# Patient Record
Sex: Female | Born: 1955 | Race: White | Hispanic: No | Marital: Married | Smoking: Former smoker
Health system: Southern US, Community
[De-identification: ages and names within clinical notes are randomized; demographics above are authoritative.]

## PROBLEM LIST (undated history)

## (undated) DIAGNOSIS — E785 Hyperlipidemia, unspecified: Secondary | ICD-10-CM

## (undated) DIAGNOSIS — F419 Anxiety disorder, unspecified: Secondary | ICD-10-CM

## (undated) DIAGNOSIS — I10 Essential (primary) hypertension: Secondary | ICD-10-CM

## (undated) DIAGNOSIS — K219 Gastro-esophageal reflux disease without esophagitis: Secondary | ICD-10-CM

## (undated) DIAGNOSIS — C801 Malignant (primary) neoplasm, unspecified: Secondary | ICD-10-CM

## (undated) HISTORY — PX: CHOLECYSTECTOMY: SHX55

## (undated) HISTORY — PX: ABDOMINAL HYSTERECTOMY: SHX81

## (undated) HISTORY — DX: Malignant (primary) neoplasm, unspecified: C80.1

---

## 1999-02-13 ENCOUNTER — Other Ambulatory Visit: Admission: RE | Admit: 1999-02-13 | Discharge: 1999-02-13 | Payer: Self-pay

## 2010-02-04 ENCOUNTER — Ambulatory Visit (HOSPITAL_COMMUNITY): Admission: RE | Admit: 2010-02-04 | Discharge: 2010-02-04 | Payer: Self-pay | Admitting: Obstetrics and Gynecology

## 2010-02-25 ENCOUNTER — Encounter: Admission: RE | Admit: 2010-02-25 | Discharge: 2010-02-25 | Payer: Self-pay | Admitting: Obstetrics and Gynecology

## 2011-02-21 ENCOUNTER — Other Ambulatory Visit (HOSPITAL_COMMUNITY): Payer: Self-pay | Admitting: Family Medicine

## 2011-02-21 DIAGNOSIS — Z1231 Encounter for screening mammogram for malignant neoplasm of breast: Secondary | ICD-10-CM

## 2011-03-10 ENCOUNTER — Ambulatory Visit (HOSPITAL_COMMUNITY)
Admission: RE | Admit: 2011-03-10 | Discharge: 2011-03-10 | Disposition: A | Discharge status: Home / Self Care | Payer: Self-pay | Source: Ambulatory Visit | Attending: Family Medicine | Admitting: Family Medicine

## 2011-03-10 DIAGNOSIS — Z1231 Encounter for screening mammogram for malignant neoplasm of breast: Secondary | ICD-10-CM

## 2012-04-01 ENCOUNTER — Other Ambulatory Visit (HOSPITAL_COMMUNITY): Payer: Self-pay | Admitting: Family Medicine

## 2014-04-21 ENCOUNTER — Other Ambulatory Visit (HOSPITAL_COMMUNITY): Payer: Self-pay | Admitting: Family Medicine

## 2014-04-21 DIAGNOSIS — Z1231 Encounter for screening mammogram for malignant neoplasm of breast: Secondary | ICD-10-CM

## 2014-05-09 ENCOUNTER — Ambulatory Visit (HOSPITAL_COMMUNITY)
Admission: RE | Admit: 2014-05-09 | Discharge: 2014-05-09 | Disposition: A | Discharge status: Home / Self Care | Payer: Self-pay | Source: Ambulatory Visit | Attending: Family Medicine | Admitting: Family Medicine

## 2014-05-09 DIAGNOSIS — Z1231 Encounter for screening mammogram for malignant neoplasm of breast: Secondary | ICD-10-CM

## 2017-02-12 ENCOUNTER — Other Ambulatory Visit: Payer: Self-pay

## 2017-02-12 DIAGNOSIS — Z1231 Encounter for screening mammogram for malignant neoplasm of breast: Secondary | ICD-10-CM

## 2017-05-18 ENCOUNTER — Other Ambulatory Visit: Payer: Self-pay | Admitting: Internal Medicine

## 2017-07-27 ENCOUNTER — Other Ambulatory Visit: Payer: Self-pay | Admitting: Obstetrics and Gynecology

## 2017-07-27 DIAGNOSIS — Z1231 Encounter for screening mammogram for malignant neoplasm of breast: Secondary | ICD-10-CM

## 2017-08-06 ENCOUNTER — Ambulatory Visit (HOSPITAL_COMMUNITY)
Admission: RE | Admit: 2017-08-06 | Discharge: 2017-08-06 | Disposition: A | Discharge status: Home / Self Care | Payer: Self-pay | Source: Ambulatory Visit | Attending: Obstetrics and Gynecology | Admitting: Obstetrics and Gynecology

## 2017-08-06 ENCOUNTER — Ambulatory Visit
Admission: RE | Admit: 2017-08-06 | Discharge: 2017-08-06 | Disposition: A | Discharge status: Home / Self Care | Payer: No Typology Code available for payment source | Source: Ambulatory Visit | Attending: Obstetrics and Gynecology | Admitting: Obstetrics and Gynecology

## 2017-08-06 ENCOUNTER — Encounter (HOSPITAL_COMMUNITY): Payer: Self-pay | Admitting: *Deleted

## 2017-08-06 VITALS — BP 108/62 | Ht 64.0 in | Wt 163.4 lb

## 2017-08-06 DIAGNOSIS — Z1231 Encounter for screening mammogram for malignant neoplasm of breast: Secondary | ICD-10-CM

## 2017-08-06 DIAGNOSIS — Z1239 Encounter for other screening for malignant neoplasm of breast: Secondary | ICD-10-CM

## 2017-08-06 HISTORY — DX: Anxiety disorder, unspecified: F41.9

## 2017-08-06 HISTORY — DX: Gastro-esophageal reflux disease without esophagitis: K21.9

## 2017-08-06 HISTORY — DX: Hyperlipidemia, unspecified: E78.5

## 2017-08-06 HISTORY — DX: Essential (primary) hypertension: I10

## 2017-08-06 NOTE — Patient Instructions (Signed)
Explained breast self awareness with Danton Sewer. Patient did not need a Pap smear today due to patient has a history of a hysterectomy for benign reasons. Let her know she doesn't need any further Pap smears due to her history of a hysterectomy for benign reasons. Referred patient to the [facility] for a screening mammogram. Appointment scheduled for Thursday, Aug 06, 2017 at 1630. Let patient know the [facility] will follow up with her within the next couple weeks with results of mammogram by letter or phone. Danton Sewer verbalized understanding.  Otoniel Myhand, Arvil Chaco, RN 3:31 PM

## 2017-08-06 NOTE — Progress Notes (Signed)
No complaints today.   Pap Smear: Pap smear not completed today. Last Pap smear was 3-4 years ago and normal per patient. Per patient has a history of an abnormal Pap smear 15 years ago that questionable colposcopy and/or cryotherapy was completed for follow-up. Patient has a history of a hysterectomy 10 years ago due to AUB and uterine prolapse. No Pap smear results are in Epic.  Physical exam: Breasts Breasts symmetrical. No skin abnormalities bilateral breasts. No nipple retraction bilateral breasts. No nipple discharge bilateral breasts. No lymphadenopathy. No lumps palpated bilateral breasts. No complaints of pain or tenderness on exam. Referred patient to the [facility] for a screening mammogram. Appointment scheduled for Thursday, Aug 06, 2017 at 1630.        Pelvic/Bimanual No Pap smear completed today since patient has a history of a hysterectomy for benign reasons. Pap smear not indicated per [facility] guidelines.   Smoking History: Patient has never smoked.  Patient Navigation: Patient education provided. Access to services provided for patient through [facility] program.   Colorectal Cancer Screening: Per patient had a colonoscopy completed 10 years ago. No complaints today. FIT Test given to patient to complete and return to [facility].  Breast and Cervical Cancer Risk Assessment: Patient has no family history of breast cancer, known genetic mutations, or radiation treatment to the chest before age 71. Per patient has a history of cervical dysplasia. Patient has no history of being immunocompromised or DES exposure in-utero.

## 2017-08-19 ENCOUNTER — Encounter (HOSPITAL_COMMUNITY): Payer: Self-pay | Admitting: *Deleted

## 2018-05-09 IMAGING — MG DIGITAL SCREENING BILATERAL MAMMOGRAM WITH TOMO AND CAD
8 series · 8 of 24 positions shown · non-contrast
Comparison: Previous exam(s).

CLINICAL DATA: Screening.

EXAM:
DIGITAL SCREENING BILATERAL MAMMOGRAM WITH TOMO AND CAD

[R MLO synth-2D]
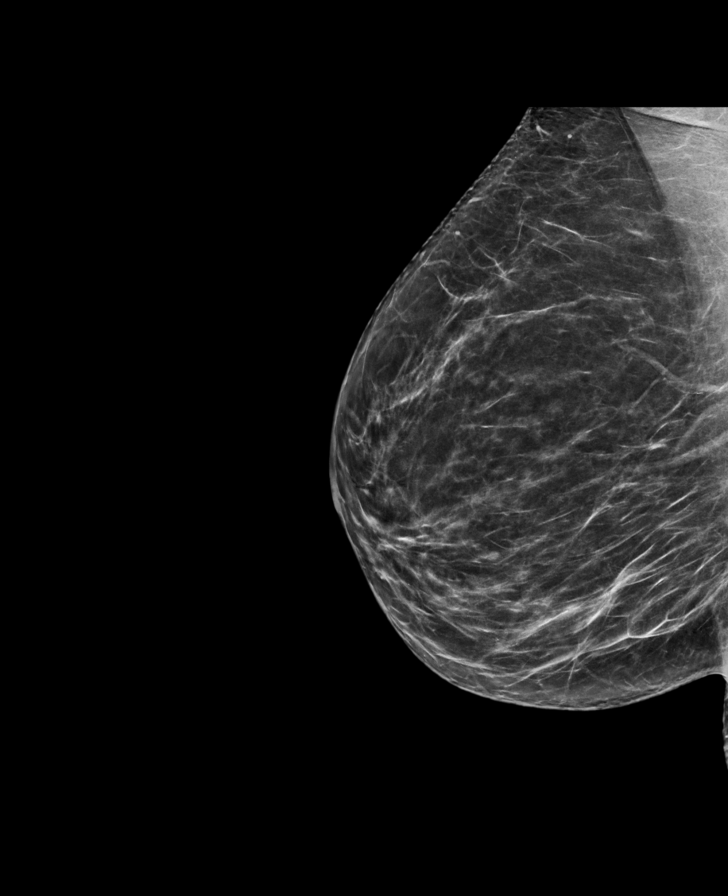

[R CC synth-2D]
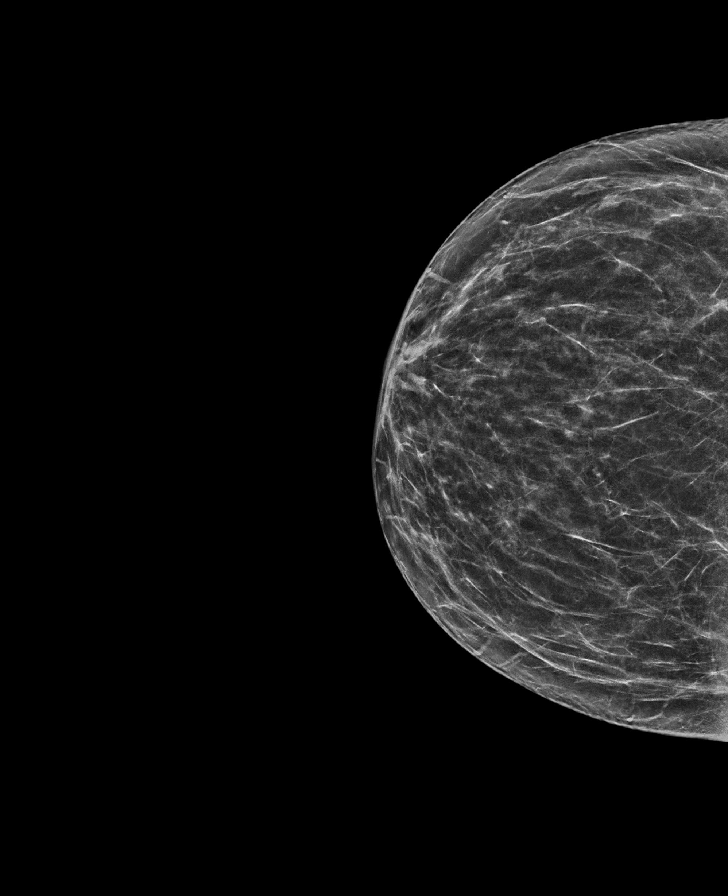

[L CC synth-2D]
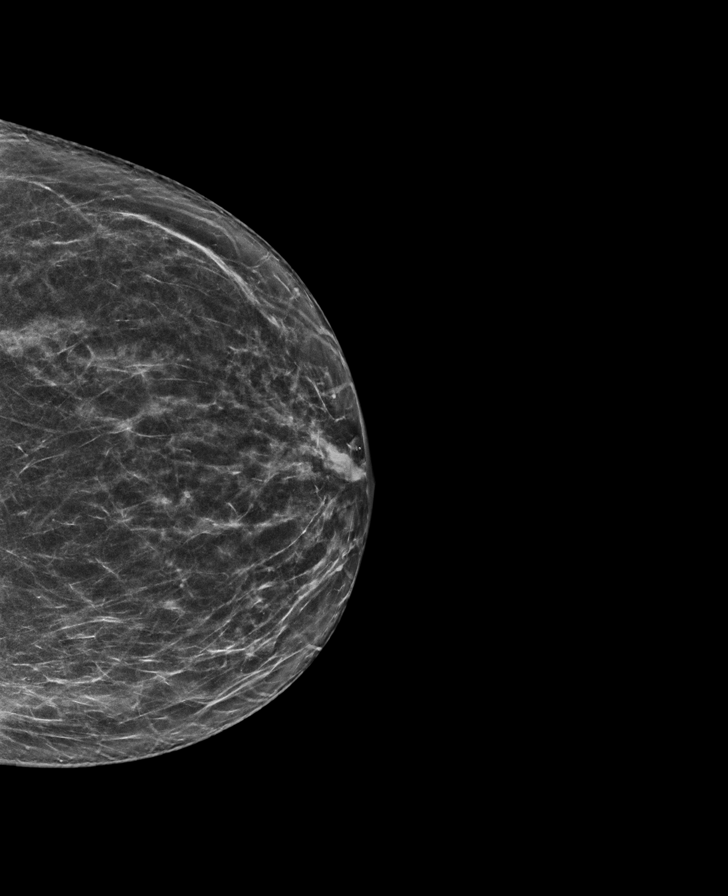

[L MLO synth-2D]
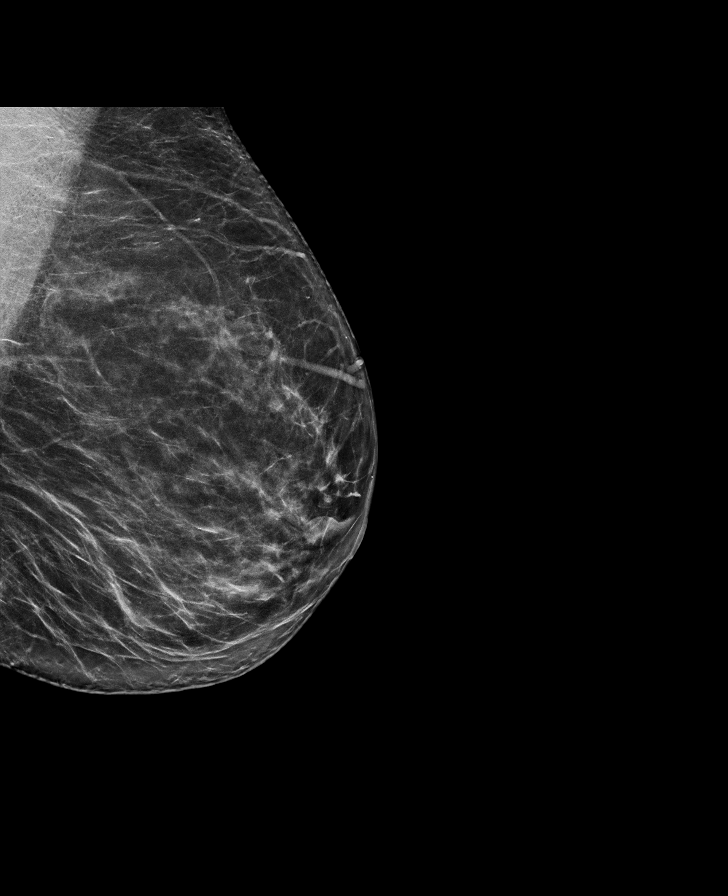

[L MLO tomo · tomo slice 40/79.0]
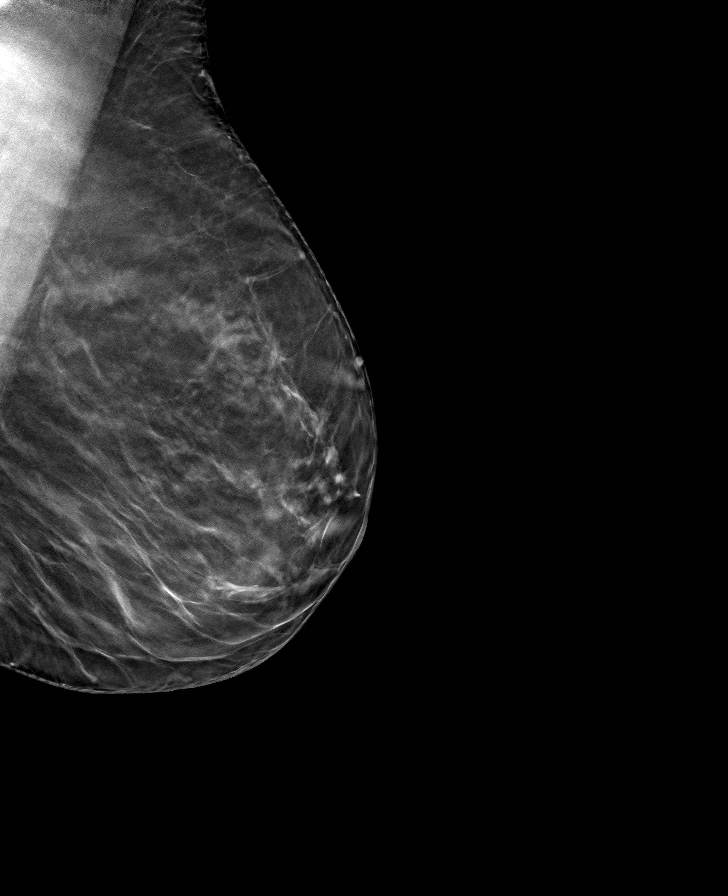

[R MLO tomo · tomo slice 37/74.0]
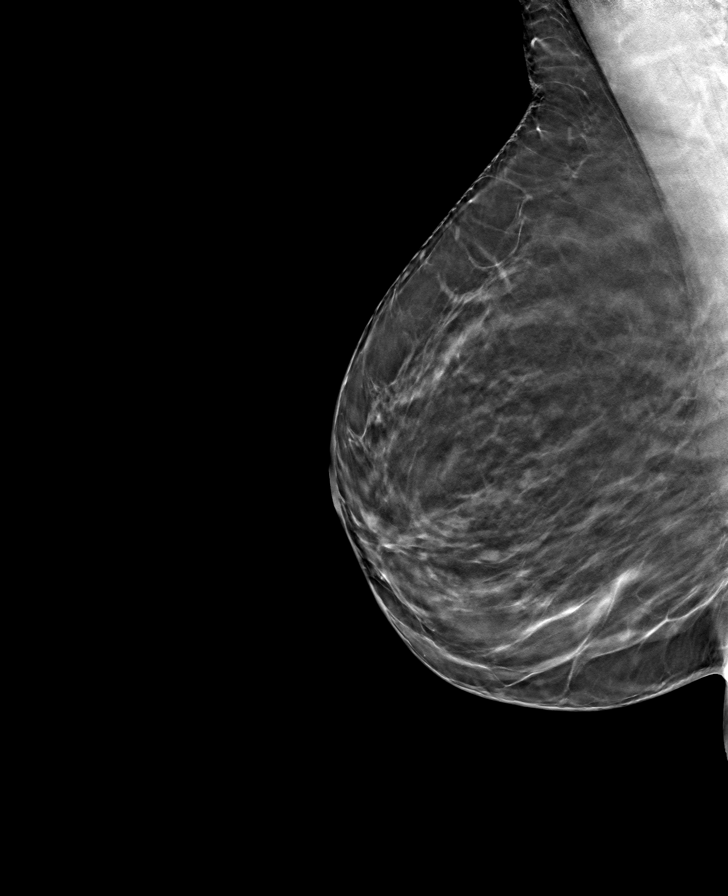

[R CC tomo · tomo slice 33/64.0]
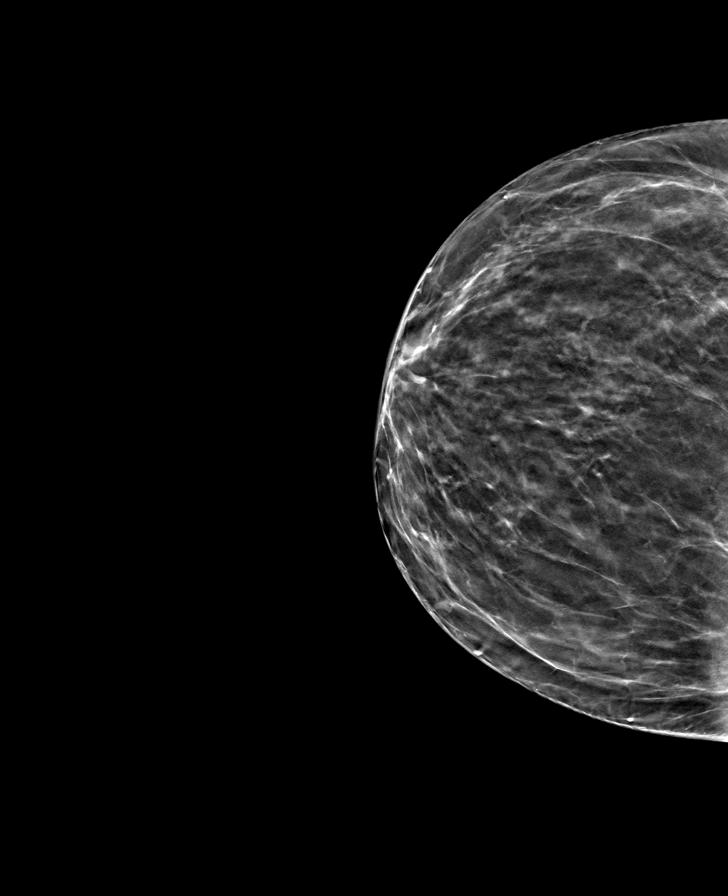

[L CC tomo · tomo slice 33/66.0]
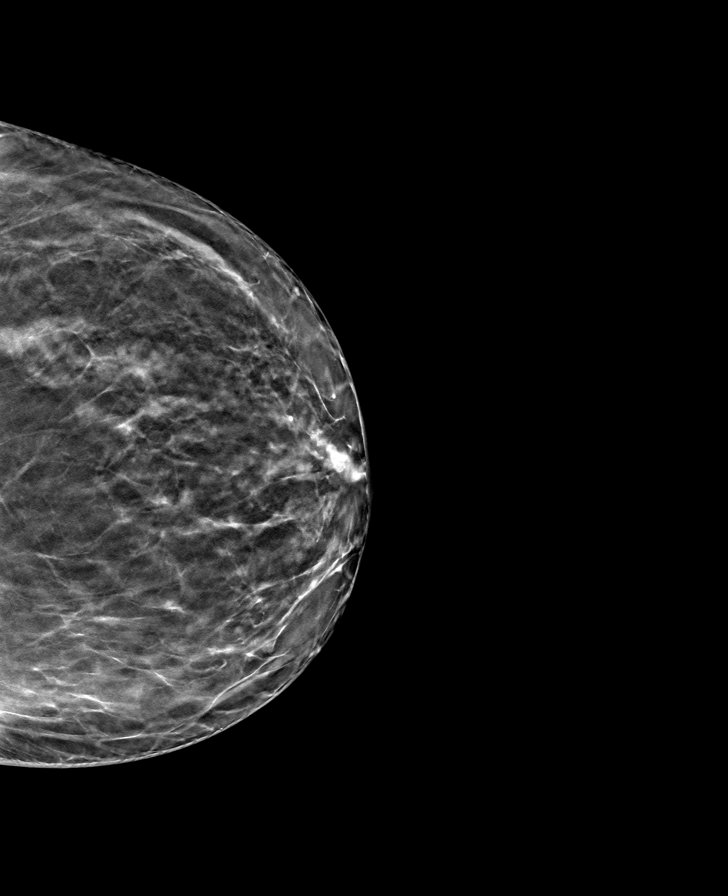

[8 of 24 positions shown; findings below may reference images not displayed]

ACR Breast Density Category c: The breast tissue is heterogeneously
dense, which may obscure small masses.
FINDINGS: There are no findings suspicious for malignancy. Images were
processed with CAD.
IMPRESSION: No mammographic evidence of malignancy. A result letter of this
screening mammogram will be mailed directly to the patient.

RECOMMENDATION:
Screening mammogram in one year. (Code:FT-U-LHB)

BI-RADS CATEGORY  1: Negative.

## 2020-07-09 ENCOUNTER — Other Ambulatory Visit: Payer: Self-pay | Admitting: General Surgery

## 2020-07-11 ENCOUNTER — Ambulatory Visit: Payer: No Typology Code available for payment source | Admitting: Gastroenterology

## 2020-08-21 ENCOUNTER — Encounter: Payer: Self-pay | Admitting: Gastroenterology

## 2020-08-21 ENCOUNTER — Ambulatory Visit: Payer: Medicare HMO | Admitting: Gastroenterology

## 2020-08-21 VITALS — BP 108/70 | HR 74 | Ht 65.0 in | Wt 173.0 lb

## 2020-08-21 DIAGNOSIS — K219 Gastro-esophageal reflux disease without esophagitis: Secondary | ICD-10-CM | POA: Diagnosis not present

## 2020-08-21 DIAGNOSIS — Z1211 Encounter for screening for malignant neoplasm of colon: Secondary | ICD-10-CM | POA: Diagnosis not present

## 2020-08-21 MED ORDER — PLENVU 140 G PO SOLR: 140.0000 g | ORAL | 0 refills | Status: DC

## 2020-08-21 NOTE — Progress Notes (Signed)
[facility] Consult Note:  History: Barbara Curtis 08/21/2020  Referring provider: Francesca Oman, DO  Reason for consult/chief complaint: Colon Cancer Screening (Last colonoscopy 12 or more years. Per patient no polyps. Father may have had colon cancer but she is unsure if it was colon primary or lung.) and Gastroesophageal Reflux (Well controlled on Prilosec)   Subjective  HPI:  This is a very pleasant 65 year old woman referred by primary care for colon cancer screening and history of reflux.  She previously saw Dr.Le in [facility], and had her last colonoscopy in 2012.  She also had an upper endoscopy on at least 1 occasion for reflux symptoms, and says a dilation was performed as well.  She has a least a decade of heartburn for which she needs daily omeprazole, and if she misses a dose she "will feel it".  [name] denies dysphagia odynophagia, nausea or vomiting. Her father was diagnosed with a late stage cancer, but she does not know for certain where the primary tumor was. Her bowel habits are regular without rectal bleeding. ROS:  Review of Systems  Constitutional: Negative for appetite change and unexpected weight change.  HENT: Negative for mouth sores and voice change.   Eyes: Negative for pain and redness.  Respiratory: Negative for cough and shortness of breath.   Cardiovascular: Negative for chest pain and palpitations.  Genitourinary: Negative for dysuria and hematuria.  Musculoskeletal: Negative for arthralgias and myalgias.  Skin: Negative for pallor and rash.  Neurological: Negative for weakness and headaches.  Hematological: Negative for adenopathy.     Past Medical History: Past Medical History:  Diagnosis Date  . Anxiety   . GERD (gastroesophageal reflux disease)   . Hyperlipidemia   . Hypertension      Past Surgical History: Past Surgical History:  Procedure Laterality Date  . ABDOMINAL HYSTERECTOMY    . CHOLECYSTECTOMY       She tells me about a urologic procedure done last year for stress incontinence, and that "an early stage tumor" was removed.  She had a follow-up exam that was reportedly normal, and has another follow-up procedure (?  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 Hypertension Mother   . Hypertension Father   . Diabetes Maternal Grandmother   . Colon cancer Neg Hx   . Pancreatic cancer Neg Hx   . Esophageal cancer Neg Hx     Social History: Social History   Socioeconomic History  . Marital status: Married    Spouse name: Not on file  . Number of children: Not on file  . Years of education: Not on file  . Highest education level: Not on file  Occupational History  . Not on file  Tobacco Use  . Smoking status: Never Smoker  . Smokeless tobacco: Never Used  Vaping Use  . Vaping Use: Never used  Substance and Sexual Activity  . Alcohol use: Not Currently  . Drug use: Not Currently  . Sexual activity: Yes  Other Topics Concern  . Not on file  Social History Narrative  . Not on file   Social Determinants of Health   Financial Resource Strain: Not on file  Food Insecurity: Not on file  Transportation Needs: Not on file  Physical Activity: Not on file  Stress: Not on file  Social Connections: Not on file    Allergies: No Known Allergies  Outpatient Meds: Current Outpatient Medications  Medication Sig Dispense Refill  . ALPRAZolam (XANAX) 1 MG tablet Take 1 mg by mouth daily.    Marland Kitchen amLODipine (NORVASC) 5 MG tablet Take 1 tablet by mouth daily.    Marland Kitchen aspirin 81 MG chewable tablet Chew 1 tablet by mouth daily.    Marland Kitchen atorvastatin (LIPITOR) 40 MG tablet Take 1 tablet by mouth daily.    . Ergocalciferol (VITAMIN D2 PO) Take 800 Units by mouth daily.    Marland Kitchen lisinopril  (ZESTRIL) 20 MG tablet Take 1 tablet by mouth daily.    . Multiple Vitamin (MULTI-VITAMIN) tablet Take 1 tablet by mouth daily.    Marland Kitchen omeprazole (PRILOSEC) 40 MG capsule Take 1 capsule by mouth daily.    Marland Kitchen PEG-KCl-NaCl-NaSulf-Na Asc-C (PLENVU) 140 g SOLR Take 140 g by mouth as directed. 1 each 0   No current facility-administered medications for this visit.      ___________________________________________________________________ Objective   Exam:  BP 108/70   Pulse 74   Ht 5\' 5"  (1.651 m)   Wt 173 lb (78.5 kg)   SpO2 97%   BMI 28.79 kg/m  Wt Readings from Last 3 Encounters:  08/21/20 173 lb (78.5 kg)  08/06/17 163 lb 6.4 oz (74.1 kg)     General: Well-appearing, normal vocal quality  Eyes: sclera anicteric, no redness  ENT: oral mucosa moist without lesions, no cervical or supraclavicular lymphadenopathy  CV: RRR without murmur, S1/S2, no JVD, no peripheral edema  Resp: clear to auscultation bilaterally, normal RR and effort noted  GI: soft, no tenderness, with active bowel sounds. No guarding or palpable organomegaly noted.  Skin; warm and dry, no rash or jaundice noted  Neuro: awake, alert and oriented x 3. Normal gross motor function and fluent speech   Assessment: Encounter Diagnoses  Name Primary?  . Gastroesophageal reflux disease, unspecified whether esophagitis present Yes  . Special screening for malignant neoplasms, colon     Average risk colorectal cancer, due for screening colonoscopy. Longstanding reflux symptoms requiring daily acid suppression therapy. We discussed the limitation of acid suppression therapy, breadth of diet and lifestyle changes required for reflux control, the possibility of reflux related complications such as esophagitis, stricture or Barrett's esophagus.  Other anatomic considerations such as gastric outlet obstruction or hiatal hernia may contribute to reflux.  Upper endoscopy warranted for Barrett's  screening.  Plan:  She was agreeable to EGD and colonoscopy after discussion of risks and benefits.  The benefits and risks of the planned procedure were described in detail with the patient or (when appropriate) their health care proxy.  Risks were outlined as including, but not limited to, bleeding, infection, perforation, adverse medication reaction leading to cardiac or pulmonary decompensation, pancreatitis (if ERCP).  The limitation of incomplete mucosal visualization was also discussed.  No guarantees or warranties were given.   Thank you for the courtesy of this consult.  Please call me with any questions or concerns.  Nelida Meuse III  CC: Referring provider noted above

## 2020-08-21 NOTE — Patient Instructions (Signed)
If you are age 67 or older, your body mass index should be between 23-30. Your Body mass index is 28.79 kg/m. If this is out of the aforementioned range listed, please consider follow up with your Primary Care Provider.  If you are age 90 or younger, your body mass index should be between 19-25. Your Body mass index is 28.79 kg/m. If this is out of the aformentioned range listed, please consider follow up with your Primary Care Provider.   You have been scheduled for an endoscopy and colonoscopy. Please follow the written instructions given to you at your visit today. Please pick up your prep supplies at the pharmacy within the next 1-3 days. If you use inhalers (even only as needed), please bring them with you on the day of your procedure.  It was a pleasure to see you today!  Thank you for trusting me with your gastrointestinal care!

## 2020-09-11 ENCOUNTER — Telehealth: Payer: Self-pay | Admitting: Gastroenterology

## 2020-09-11 NOTE — Telephone Encounter (Signed)
Patient called said [facility] has faxed twice an order to get an alternative medication due to them not having it in stock and also her insurance not covering.

## 2020-09-13 NOTE — Telephone Encounter (Signed)
Left a detailed voicemail for the patient that prep is at front desk for pick up.

## 2020-10-15 ENCOUNTER — Encounter: Payer: Medicare HMO | Admitting: Gastroenterology

## 2020-10-25 ENCOUNTER — Telehealth: Payer: Self-pay | Admitting: Gastroenterology

## 2020-10-25 NOTE — Telephone Encounter (Signed)
Inbound call from patient inquiring about what medications that she currently takes does she need to stop prior to procedure.  Please advise.

## 2020-10-25 NOTE — Telephone Encounter (Signed)
LM on vm. 

## 2020-10-25 NOTE — Telephone Encounter (Signed)
Questions answered and went over new instructions in detail with patient over phone.

## 2020-10-29 ENCOUNTER — Ambulatory Visit (AMBULATORY_SURGERY_CENTER): Payer: Medicare HMO | Admitting: Gastroenterology

## 2020-10-29 ENCOUNTER — Encounter: Payer: Self-pay | Admitting: Gastroenterology

## 2020-10-29 ENCOUNTER — Other Ambulatory Visit: Payer: Self-pay

## 2020-10-29 VITALS — BP 105/68 | HR 58 | Temp 98.0°F | Resp 18 | Ht 65.0 in | Wt 173.0 lb

## 2020-10-29 DIAGNOSIS — K449 Diaphragmatic hernia without obstruction or gangrene: Secondary | ICD-10-CM

## 2020-10-29 DIAGNOSIS — D122 Benign neoplasm of ascending colon: Secondary | ICD-10-CM

## 2020-10-29 DIAGNOSIS — K219 Gastro-esophageal reflux disease without esophagitis: Secondary | ICD-10-CM

## 2020-10-29 DIAGNOSIS — Z1211 Encounter for screening for malignant neoplasm of colon: Secondary | ICD-10-CM

## 2020-10-29 DIAGNOSIS — K259 Gastric ulcer, unspecified as acute or chronic, without hemorrhage or perforation: Secondary | ICD-10-CM | POA: Diagnosis not present

## 2020-10-29 MED ORDER — SODIUM CHLORIDE 0.9 % IV SOLN: 500.0000 mL | INTRAVENOUS | Status: DC

## 2020-10-29 NOTE — Progress Notes (Signed)
PT taken to PACU. Monitors in place. VSS. Report given to RN. 

## 2020-10-29 NOTE — Patient Instructions (Signed)
Handouts given for polyps and diverticulosis.  YOU HAD AN ENDOSCOPIC PROCEDURE TODAY AT [facility]:   Refer to the procedure report that was given to you for any specific questions about what was found during the examination.  If the procedure report does not answer your questions, please call your gastroenterologist to clarify.  If you requested that your care partner not be given the details of your procedure findings, then the procedure report has been included in a sealed envelope for you to review at your convenience later.  YOU SHOULD EXPECT: Some feelings of bloating in the abdomen. Passage of more gas than usual.  Walking can help get rid of the air that was put into your GI tract during the procedure and reduce the bloating. If you had a lower endoscopy (such as a colonoscopy or flexible sigmoidoscopy) you may notice spotting of blood in your stool or on the toilet paper. If you underwent a bowel prep for your procedure, you may not have a normal bowel movement for a few days.  Please Note:  You might notice some irritation and congestion in your nose or some drainage.  This is from the oxygen used during your procedure.  There is no need for concern and it should clear up in a day or so.  SYMPTOMS TO REPORT IMMEDIATELY:  Following lower endoscopy (colonoscopy or flexible sigmoidoscopy):  Excessive amounts of blood in the stool  Significant tenderness or worsening of abdominal pains  Swelling of the abdomen that is new, acute  Fever of 100F or higher  Following upper endoscopy (EGD)  Vomiting of blood or coffee ground material  New chest pain or pain under the shoulder blades  Painful or persistently difficult swallowing  New shortness of breath  Black, tarry-looking stools  For urgent or emergent issues, a gastroenterologist can be reached at any hour by calling [phone]. Do not use MyChart messaging for urgent concerns.    DIET:  We do recommend a small  meal at first, but then you may proceed to your regular diet.  Drink plenty of fluids but you should avoid alcoholic beverages for 24 hours.  ACTIVITY:  You should plan to take it easy for the rest of today and you should NOT DRIVE or use heavy machinery until tomorrow (because of the sedation medicines used during the test).    FOLLOW UP: Our staff will call the number listed on your records 48-72 hours following your procedure to check on you and address any questions or concerns that you may have regarding the information given to you following your procedure. If we do not reach you, we will leave a message.  We will attempt to reach you two times.  During this call, we will ask if you have developed any symptoms of COVID 19. If you develop any symptoms (ie: fever, flu-like symptoms, shortness of breath, cough etc.) before then, please call [phone].  If you test positive for Covid 19 in the 2 weeks post procedure, please call and report this information to [place].    If any biopsies were taken you will be contacted by phone or by letter within the next 1-3 weeks.  Please call us at [phone] if you have not heard about the biopsies in 3 weeks.    SIGNATURES/CONFIDENTIALITY: You and/or your care partner have signed paperwork which will be entered into your electronic medical record.  These signatures attest to the fact that that the information above on your  After Visit Summary has been reviewed and is understood.  Full responsibility of the confidentiality of this discharge information lies with you and/or your care-partner.

## 2020-10-29 NOTE — Progress Notes (Signed)
Called to room to assist during endoscopic procedure.  Patient ID and intended procedure confirmed with present staff. Received instructions for my participation in the procedure from the performing physician.  

## 2020-10-29 NOTE — Op Note (Signed)
[name] Procedure Date: 10/29/2020 3:12 PM MRN: [id number] Endoscopist: [place]. Loletha Carrow , MD Age: 65 Referring MD:  Date of Birth: 1956-02-03 Gender: Female Account #: [id number] Procedure:                Colonoscopy Indications:              Screening for colorectal malignant neoplasm                           no polyps on last colonoscopy 2012 at outside                            practice Medicines:                Monitored Anesthesia Care Procedure:                Pre-Anesthesia Assessment:                           - Prior to the procedure, a History and Physical                            was performed, and patient medications and                            allergies were reviewed. The patient's tolerance of                            previous anesthesia was also reviewed. The risks                            and benefits of the procedure and the sedation                            options and risks were discussed with the patient.                            All questions were answered, and informed consent                            was obtained. Prior Anticoagulants: The patient has                            taken no previous anticoagulant or antiplatelet                            agents. ASA Grade Assessment: II - A patient with                            mild systemic disease. After reviewing the risks                            and benefits, the patient was deemed in  satisfactory condition to undergo the procedure.                           After obtaining informed consent, the colonoscope                            was passed under direct vision. Throughout the                            procedure, the patient's blood pressure, pulse, and                            oxygen saturations were monitored continuously. The                            Olympus CF-HQ190L ([id number]) Colonoscope was                             introduced through the anus and advanced to the the                            cecum, identified by appendiceal orifice and                            ileocecal valve. The colonoscopy was performed                            without difficulty. The patient tolerated the                            procedure well. The quality of the bowel                            preparation was good. The ileocecal valve,                            appendiceal orifice, and rectum were photographed. Scope In: 3:19:46 PM Scope Out: 3:37:49 PM Scope Withdrawal Time: 0 hours 12 minutes 50 seconds  Total Procedure Duration: 0 hours 18 minutes 3 seconds  Findings:                 The perianal and digital rectal examinations were                            normal.                           A 6-8 mm polyp was found in the ascending colon.                            The polyp was sessile. The polyp was removed with a                            cold snare. Resection and retrieval were complete.  Multiple small and large-mouthed diverticula were                            found in the left colon and right colon (L>R).                           The exam was otherwise without abnormality on                            direct and retroflexion views. Complications:            No immediate complications. Estimated Blood Loss:     Estimated blood loss was minimal. Impression:               - One 6-8 mm polyp in the ascending colon, removed                            with a cold snare. Resected and retrieved.                           - Diverticulosis in the left colon and in the right                            colon.                           - The examination was otherwise normal on direct                            and retroflexion views. Recommendation:           - Patient has a contact number available for                            emergencies. The signs and symptoms of potential                             delayed complications were discussed with the                            patient. Return to normal activities tomorrow.                            Written discharge instructions were provided to the                            patient.                           - Resume previous diet.                           - Continue present medications.                           - Await pathology results.                           -  Repeat colonoscopy is recommended for                            surveillance. The colonoscopy date will be                            determined after pathology results from today's                            exam become available for review.                           - See the other procedure note for documentation of                            additional recommendations. Barbara Curtis L. Loletha Carrow, MD 10/29/2020 3:50:14 PM This report has been signed electronically.

## 2020-10-29 NOTE — Progress Notes (Signed)
Vs CW ° °

## 2020-10-29 NOTE — Op Note (Signed)
[facility] Patient Name: Barbara Curtis Procedure Date: 10/29/2020 3:02 PM MRN: [id number] Endoscopist: Mallie Mussel L. Loletha Carrow , MD Age: 65 Referring MD:  Date of Birth: 06-10-1955 Gender: Female Account #: [id number] Procedure:                Upper GI endoscopy Indications:              Gastro-esophageal reflux disease, Screening for                            Barrett's esophagus in patient at risk for this                            condition Medicines:                Monitored Anesthesia Care Procedure:                Pre-Anesthesia Assessment:                           - Prior to the procedure, a History and Physical                            was performed, and patient medications and                            allergies were reviewed. The patient's tolerance of                            previous anesthesia was also reviewed. The risks                            and benefits of the procedure and the sedation                            options and risks were discussed with the patient.                            All questions were answered, and informed consent                            was obtained. Prior Anticoagulants: The patient has                            taken no previous anticoagulant or antiplatelet                            agents. ASA Grade Assessment: II - A patient with                            mild systemic disease. After reviewing the risks                            and benefits, the patient was deemed in  satisfactory condition to undergo the procedure.                           After obtaining informed consent, the endoscope was                            passed under direct vision. Throughout the                            procedure, the patient's blood pressure, pulse, and                            oxygen saturations were monitored continuously. The                            Endoscope was introduced through the mouth, and                             advanced to the second part of duodenum. The upper                            GI endoscopy was accomplished without difficulty.                            The patient tolerated the procedure well. Scope In: Scope Out: Findings:                 The esophagus was normal.                           There is no endoscopic evidence of Barrett's                            esophagus, esophagitis, hiatal hernia or stricture                            in the entire esophagus.                           Multiple pedunculated and sessile fundic gland                            polyps were found in the gastric fundus and in the                            gastric body. (none with suspicious appearance                            under WL or NBI)                           The exam of the stomach was otherwise normal.                           The cardia and gastric fundus were normal on  retroflexion.                           The examined duodenum was normal. Complications:            No immediate complications. Estimated Blood Loss:     Estimated blood loss: none. Impression:               - Normal esophagus.                           - Multiple fundic gland polyps.                           - Normal examined duodenum.                           - No specimens collected. Recommendation:           - Patient has a contact number available for                            emergencies. The signs and symptoms of potential                            delayed complications were discussed with the                            patient. Return to normal activities tomorrow.                            Written discharge instructions were provided to the                            patient.                           - Resume previous diet.                           - Continue present medications.                           - Follow an antireflux regimen  indefinitely.                           - See the other procedure note for documentation of                            additional recommendations.                           - Return to my office PRN. Barbara Curtis L. Loletha Carrow, MD 10/29/2020 3:54:24 PM This report has been signed electronically.

## 2020-10-31 ENCOUNTER — Telehealth: Payer: Self-pay

## 2020-10-31 NOTE — Telephone Encounter (Signed)
  Follow up Call-  Call back number 10/29/2020  Post procedure Call Back phone  # [phone]  Permission to leave phone message Yes  Some recent data might be hidden     Patient questions:  Do you have a fever, pain , or abdominal swelling? No. Pain Score  0 *  Have you tolerated food without any problems? Yes.    Have you been able to return to your normal activities? Yes.    Do you have any questions about your discharge instructions: Diet   No. Medications  No. Follow up visit  No.  Do you have questions or concerns about your Care? No.  Actions: * If pain score is 4 or above: No action needed, pain <4.

## 2020-11-01 ENCOUNTER — Encounter: Payer: Self-pay | Admitting: Gastroenterology
